# Patient Record
Sex: Female | Born: 1950 | Race: White | Hispanic: No | Marital: Married | Smoking: Never smoker
Health system: Southern US, Community
[De-identification: ages and names within clinical notes are randomized; demographics above are authoritative.]

## PROBLEM LIST (undated history)

## (undated) DIAGNOSIS — C801 Malignant (primary) neoplasm, unspecified: Secondary | ICD-10-CM

## (undated) DIAGNOSIS — E079 Disorder of thyroid, unspecified: Secondary | ICD-10-CM

## (undated) DIAGNOSIS — E7800 Pure hypercholesterolemia, unspecified: Secondary | ICD-10-CM

## (undated) DIAGNOSIS — I10 Essential (primary) hypertension: Secondary | ICD-10-CM

## (undated) DIAGNOSIS — E119 Type 2 diabetes mellitus without complications: Secondary | ICD-10-CM

## (undated) DIAGNOSIS — J45909 Unspecified asthma, uncomplicated: Secondary | ICD-10-CM

## (undated) HISTORY — PX: BREAST BIOPSY: SHX20

## (undated) HISTORY — PX: KNEE SURGERY: SHX244

---

## 2004-10-29 ENCOUNTER — Ambulatory Visit: Payer: Self-pay

## 2004-12-02 ENCOUNTER — Ambulatory Visit: Payer: Self-pay

## 2004-12-17 ENCOUNTER — Ambulatory Visit: Payer: Self-pay

## 2008-02-01 ENCOUNTER — Emergency Department: Payer: Self-pay | Admitting: Unknown Physician Specialty

## 2008-02-01 ENCOUNTER — Other Ambulatory Visit: Payer: Self-pay

## 2008-11-21 ENCOUNTER — Ambulatory Visit: Payer: Self-pay | Admitting: Internal Medicine

## 2010-07-03 ENCOUNTER — Ambulatory Visit: Payer: Self-pay | Admitting: Urology

## 2010-12-14 ENCOUNTER — Emergency Department: Payer: Self-pay | Admitting: Unknown Physician Specialty

## 2014-09-14 ENCOUNTER — Emergency Department: Payer: Self-pay | Admitting: Emergency Medicine

## 2015-09-07 IMAGING — US US EXTREM NON VASC*L* COMPLETE
1 series · 13 of 20 positions shown · non-contrast
Comparison: Left lower extremity venous Doppler ultrasound
performed 12/17/2004

CLINICAL DATA: Swelling at the medial and posterior aspect of the
left knee, of uncertain etiology. Initial encounter.

EXAM:
ULTRASOUND LEFT LOWER EXTREMITY LIMITED
TECHNIQUE: Ultrasound examination of the lower extremity soft tissues was
performed in the area of clinical concern.

[Series 1: us extrem non vasc*left* complete · 0.06mm/px · 20 acquisitions, 13 frames shown]
[im 1/20]
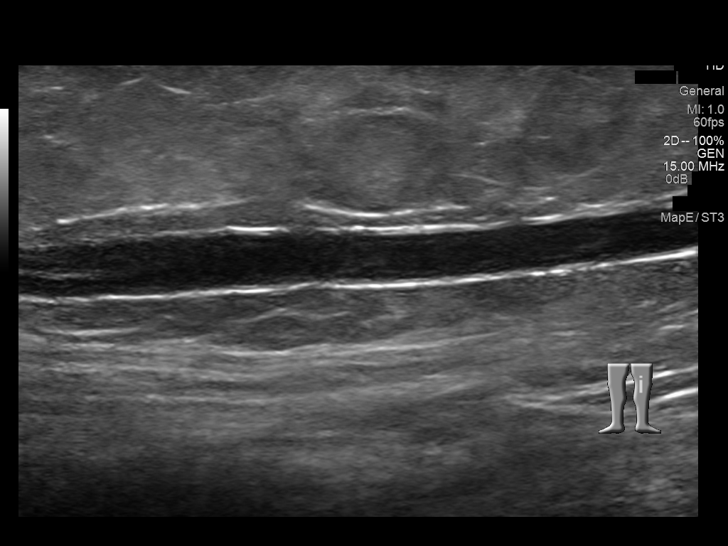
[im 3/20]
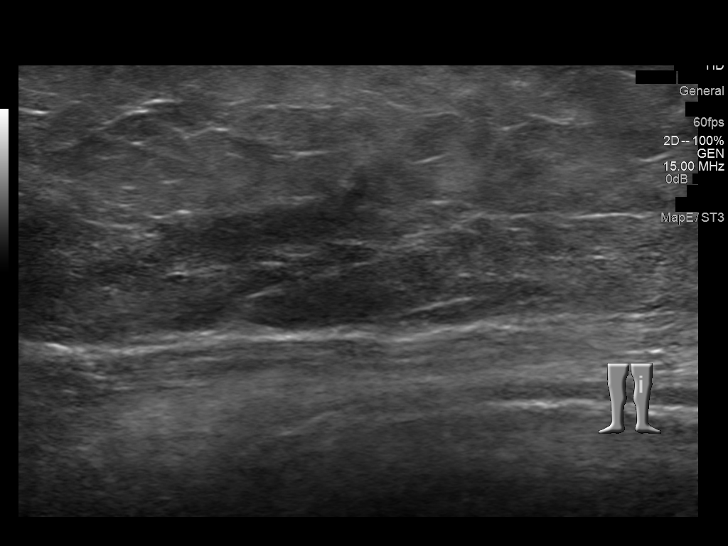
[im 4/20]
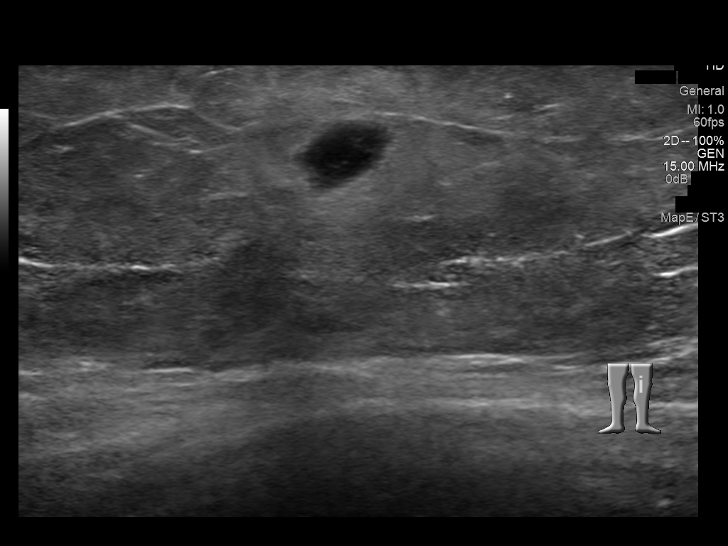
[im 6/20]
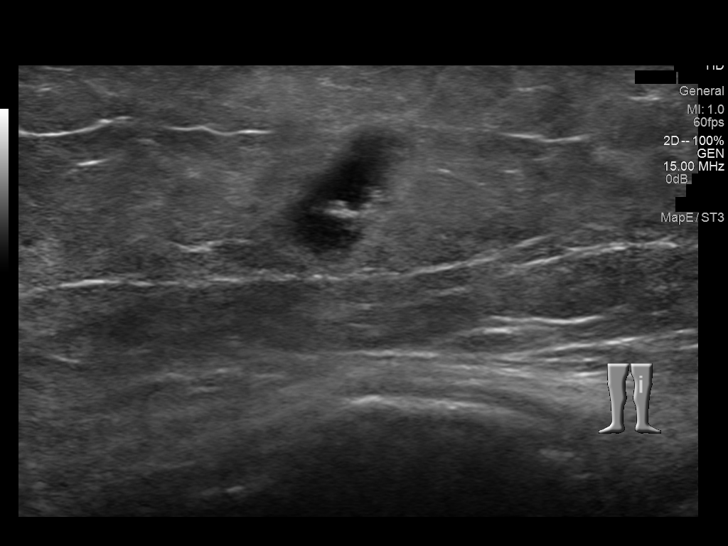
[im 7/20]
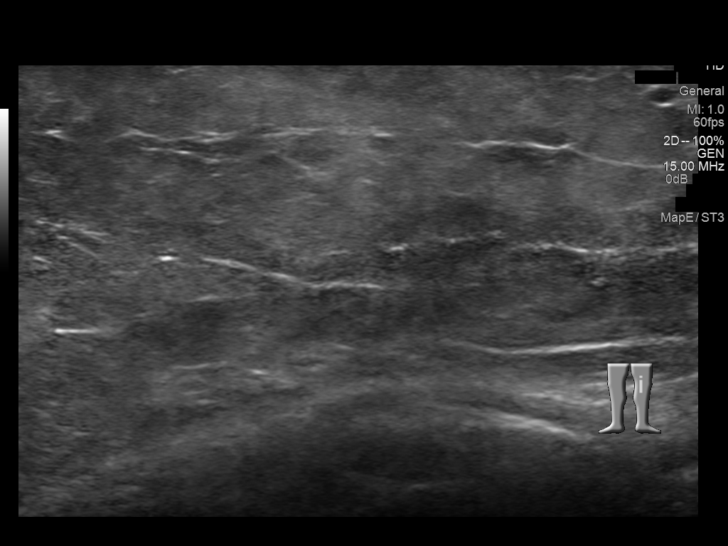
[im 9/20]
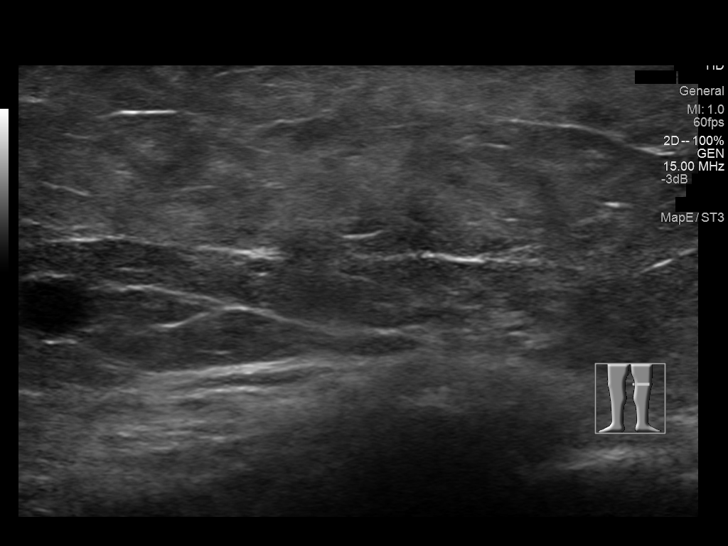
[im 11/20]
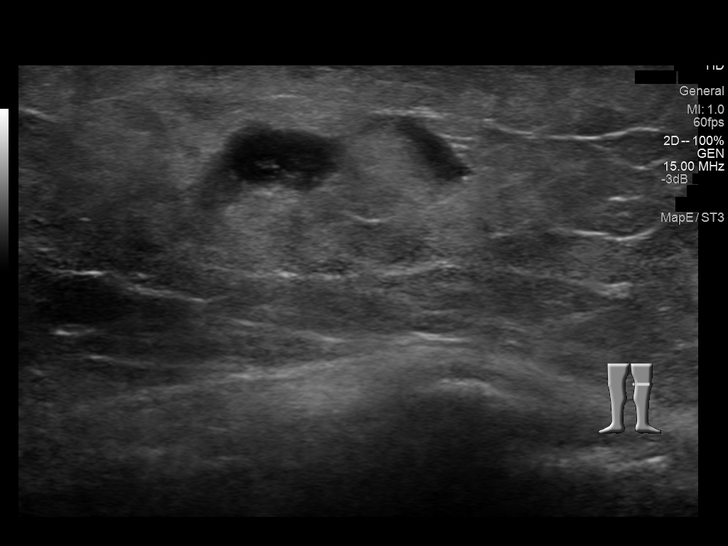
[im 12/20]
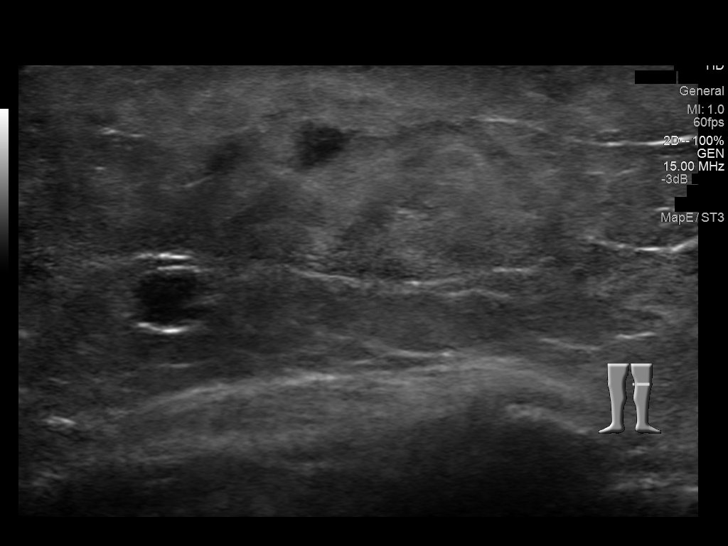
[im 14/20]
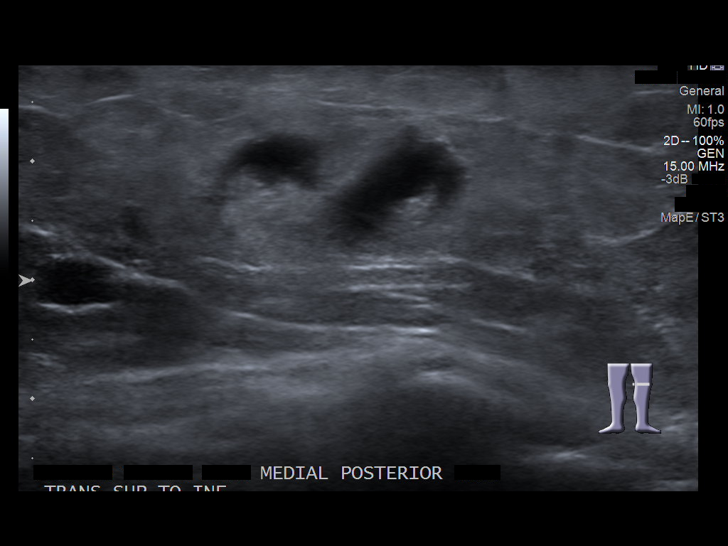
[im 15/20]
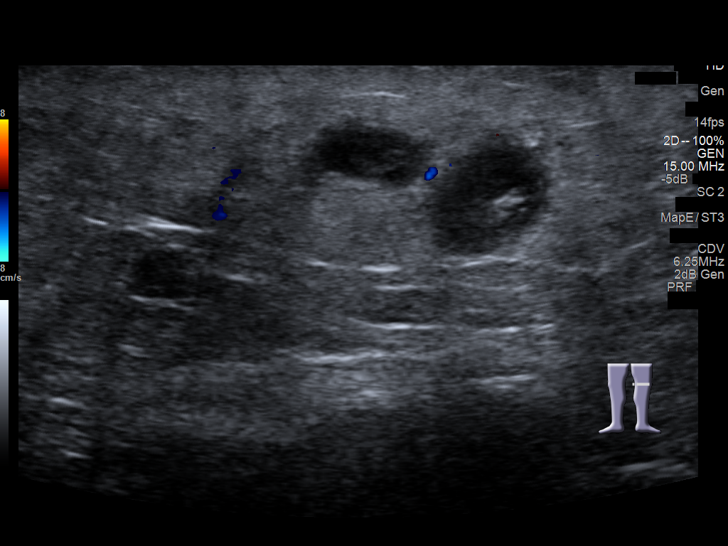
[im 17/20]
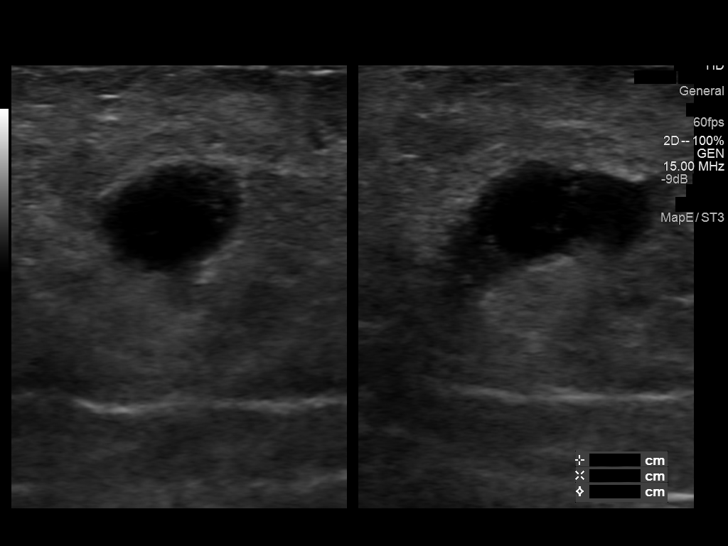
[im 18/20]
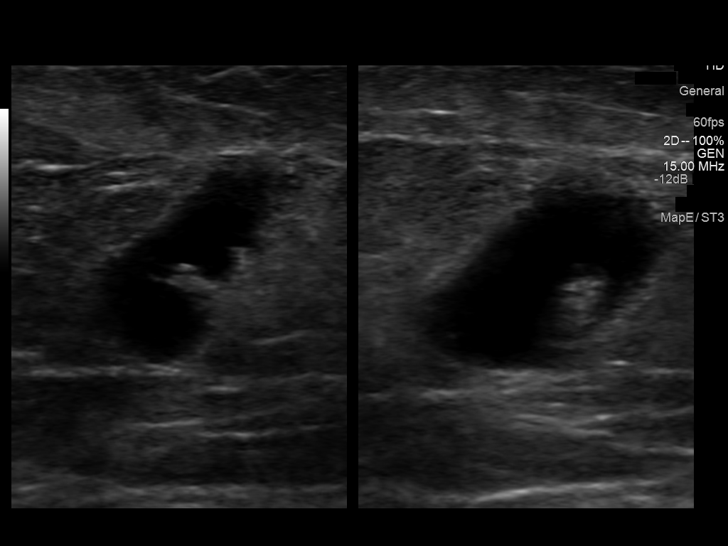
[im 20/20]
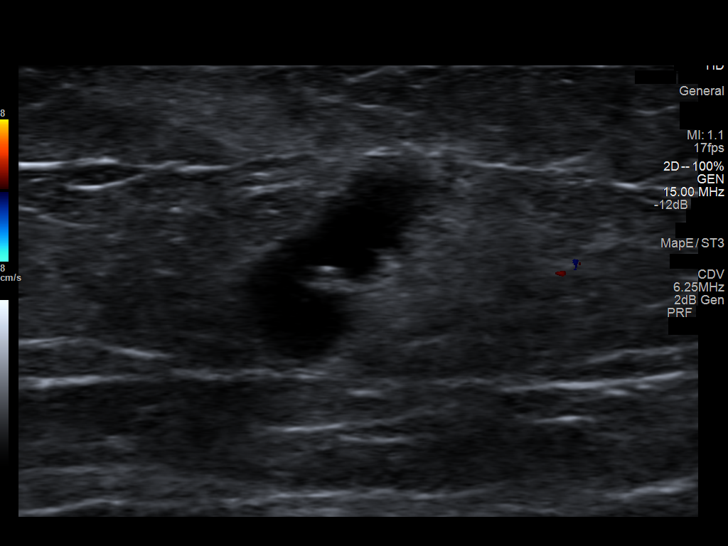

[13 of 20 positions shown; findings below may reference images not displayed]

FINDINGS: Two small collections of fluid are seen within the soft tissues at
the site of swelling and tenderness, medial and posterior to the
left knee. There is suggestion of a collection of fluid in this
region on the prior ultrasound. These measure 1.2 x 0.8 x 0.6 cm,
and 1.4 x 1.3 x 0.6 cm. On cine images, these may be contiguous, and
raise question for a Baker's cyst. No abnormal associated blood flow
is seen on limited color Doppler evaluation. The surrounding soft
tissues are grossly unremarkable in appearance.
IMPRESSION: Two small collections of fluid within the soft tissues, medial and
posterior to the left knee, at the site of swelling and tenderness.
These may be continuous and could reflect a Baker's cyst. No
abnormal associated blood flow seen. This may have been present in

## 2022-11-12 ENCOUNTER — Other Ambulatory Visit: Payer: Self-pay

## 2022-11-12 ENCOUNTER — Encounter: Payer: Self-pay | Admitting: Emergency Medicine

## 2022-11-12 ENCOUNTER — Emergency Department
Admission: EM | Admit: 2022-11-12 | Discharge: 2022-11-12 | Disposition: A | Discharge status: Home / Self Care | Payer: Medicare Other | Attending: Student in an Organized Health Care Education/Training Program | Admitting: Student in an Organized Health Care Education/Training Program

## 2022-11-12 DIAGNOSIS — C801 Malignant (primary) neoplasm, unspecified: Secondary | ICD-10-CM

## 2022-11-12 DIAGNOSIS — E11649 Type 2 diabetes mellitus with hypoglycemia without coma: Secondary | ICD-10-CM | POA: Insufficient documentation

## 2022-11-12 DIAGNOSIS — Z8616 Personal history of COVID-19: Secondary | ICD-10-CM | POA: Diagnosis not present

## 2022-11-12 DIAGNOSIS — E162 Hypoglycemia, unspecified: Secondary | ICD-10-CM

## 2022-11-12 HISTORY — DX: Pure hypercholesterolemia, unspecified: E78.00

## 2022-11-12 HISTORY — DX: Disorder of thyroid, unspecified: E07.9

## 2022-11-12 HISTORY — DX: Malignant (primary) neoplasm, unspecified: C80.1

## 2022-11-12 HISTORY — DX: Essential (primary) hypertension: I10

## 2022-11-12 HISTORY — DX: Unspecified asthma, uncomplicated: J45.909

## 2022-11-12 HISTORY — DX: Type 2 diabetes mellitus without complications: E11.9

## 2022-11-12 LAB — COMPREHENSIVE METABOLIC PANEL
ALT: 11 U/L (ref 0–44)
AST: 24 U/L (ref 15–41)
Albumin: 4.1 g/dL (ref 3.5–5.0)
Alkaline Phosphatase: 54 U/L (ref 38–126)
Anion gap: 11 (ref 5–15)
BUN: 17 mg/dL (ref 8–23)
CO2: 24 mmol/L (ref 22–32)
Calcium: 8.8 mg/dL — ABNORMAL LOW (ref 8.9–10.3)
Chloride: 103 mmol/L (ref 98–111)
Creatinine, Ser: 0.82 mg/dL (ref 0.44–1.00)
GFR, Estimated: >60 mL/min (ref >60)
Glucose, Bld: 126 mg/dL — ABNORMAL HIGH (ref 70–99)
Potassium: 3.3 mmol/L — ABNORMAL LOW (ref 3.5–5.1)
Sodium: 138 mmol/L (ref 135–145)
Total Bilirubin: 0.7 mg/dL (ref 0.3–1.2)
Total Protein: 7.3 g/dL (ref 6.5–8.1)

## 2022-11-12 LAB — CBC WITH DIFFERENTIAL/PLATELET
Abs Immature Granulocytes: 0.02 10*3/uL (ref 0.00–0.07)
Basophils Absolute: 0 10*3/uL (ref 0.0–0.1)
Basophils Relative: 1 %
Eosinophils Absolute: 0 10*3/uL (ref 0.0–0.5)
Eosinophils Relative: 0 %
HCT: 41.7 % (ref 36.0–46.0)
Hemoglobin: 13.2 g/dL (ref 12.0–15.0)
Immature Granulocytes: 0 %
Lymphocytes Relative: 19 %
Lymphs Abs: 1.6 10*3/uL (ref 0.7–4.0)
MCH: 25.5 pg — ABNORMAL LOW (ref 26.0–34.0)
MCHC: 31.7 g/dL (ref 30.0–36.0)
MCV: 80.7 fL (ref 80.0–100.0)
Monocytes Absolute: 0.5 10*3/uL (ref 0.1–1.0)
Monocytes Relative: 6 %
Neutro Abs: 6.2 10*3/uL (ref 1.7–7.7)
Neutrophils Relative %: 74 %
Platelets: 384 10*3/uL (ref 150–400)
RBC: 5.17 MIL/uL — ABNORMAL HIGH (ref 3.87–5.11)
RDW: 15.8 % — ABNORMAL HIGH (ref 11.5–15.5)
WBC: 8.4 10*3/uL (ref 4.0–10.5)
nRBC: 0 % (ref 0.0–0.2)

## 2022-11-12 LAB — CBG MONITORING, ED
Glucose-Capillary: 141 mg/dL — ABNORMAL HIGH (ref 70–99)
Glucose-Capillary: 115 mg/dL — ABNORMAL HIGH (ref 70–99)

## 2022-11-12 MED ORDER — SODIUM CHLORIDE 0.9 % IV BOLUS
1000.0000 mL | Freq: Once | INTRAVENOUS | Status: AC
  Administered 2022-11-12: 1000 mL via INTRAVENOUS

## 2022-11-12 NOTE — ED Triage Notes (Signed)
Pt states took her insulin as prescribed yesterday and today but does not check sugar at home. Pt feeling much better since getting glucose from EMS.   CBG 141

## 2022-11-12 NOTE — ED Triage Notes (Signed)
Patient arrived by EMS from home. Reports cramps all over. Initial CBG 49 with EMS. EMS administered oral glucose and got 69 CBG reading. Then gave 50D10 glucose and rose to 155CBG.  A&O x4 upon arrival   EMS vitals: 156/102b/p 118HR 97.6oral 99% RA

## 2022-11-12 NOTE — ED Provider Notes (Signed)
[facility] Provider Note    Event Date/Time   First MD Initiated Contact with Patient 11/12/22 1219     (approximate)   History   Hypoglycemia   HPI  Elizabeth Walls is a 72 y.o. female with a history of diabetes presents to the ER for evaluation of hypoglycemic episode that occurred this morning.  Not had anything to eat or drink this morning.  States that she has not felt well since July of last year when she got COVID again.  Frequently has poor p.o. intake and generalized malaise.  Denies any pain today.  Felt like her blood sugar was dropping went down to 49.  EMS was called she was given D50 with improvement.  She feels much better but admits that she has not had to pee this morning and does feel hungry.     Physical Exam   Triage Vital Signs: ED Triage Vitals  Enc Vitals Group     BP 11/12/22 1149 131/74     Pulse Rate 11/12/22 1149 (!) 118     Resp 11/12/22 1149 18     Temp 11/12/22 1155 98.2 F (36.8 C)     Temp Source 11/12/22 1155 Oral     SpO2 11/12/22 1149 100 %     Weight 11/12/22 1150 187 lb (84.8 kg)     Height 11/12/22 1150 5\' 4"  (1.626 m)     Head Circumference --      Peak Flow --      Pain Score 11/12/22 1150 2     Pain Loc --      Pain Edu? --      Excl. in GC? --     Most recent vital signs: Vitals:   11/12/22 1149 11/12/22 1155  BP: 131/74   Pulse: (!) 118   Resp: 18   Temp:  98.2 F (36.8 C)  SpO2: 100%      Constitutional: Alert  Eyes: Conjunctivae are normal.  Head: Atraumatic. Nose: No congestion/rhinnorhea. Mouth/Throat: Mucous membranes are moist.   Neck: Painless ROM.  Cardiovascular:   Good peripheral circulation. Respiratory: Normal respiratory effort.  No retractions.  Gastrointestinal: Soft and nontender.  Musculoskeletal:  no deformity Neurologic:  MAE spontaneously. No gross focal neurologic deficits are appreciated.  Skin:  Skin is warm, dry and intact. No rash noted. Psychiatric: Mood and  affect are normal. Speech and behavior are normal.    ED Results / Procedures / Treatments   Labs (all labs ordered are listed, but only abnormal results are displayed) Labs Reviewed  CBC WITH DIFFERENTIAL/PLATELET - Abnormal; Notable for the following components:      Result Value   RBC 5.17 (*)    MCH 25.5 (*)    RDW 15.8 (*)    All other components within normal limits  COMPREHENSIVE METABOLIC PANEL - Abnormal; Notable for the following components:   Potassium 3.3 (*)    Glucose, Bld 126 (*)    Calcium 8.8 (*)    All other components within normal limits  CBG MONITORING, ED - Abnormal; Notable for the following components:   Glucose-Capillary 141 (*)    All other components within normal limits  CBG MONITORING, ED - Abnormal; Notable for the following components:   Glucose-Capillary 115 (*)    All other components within normal limits  URINALYSIS, ROUTINE W REFLEX MICROSCOPIC     EKG     RADIOLOGY    PROCEDURES:  Critical Care performed: No  Procedures  MEDICATIONS ORDERED IN ED: Medications  sodium chloride 0.9 % bolus 1,000 mL (1,000 mLs Intravenous New Bag/Given 11/12/22 1317)     IMPRESSION / MDM / ASSESSMENT AND PLAN / ED COURSE  I reviewed the triage vital signs and the nursing notes.                              Differential diagnosis includes, but is not limited to, hyperglycemia, dehydration, electrolyte abnormality, medication effect, sepsis  Patient presenting to the ER for evaluation of symptoms as described above.  Based on symptoms, risk factors and considered above differential, this presenting complaint could reflect a potentially life-threatening illness therefore the patient will be placed on continuous pulse oximetry and telemetry for monitoring.  Laboratory evaluation will be sent to evaluate for the above complaints.      Clinical Course as of 11/12/22 1409  Thu Nov 12, 2022  1408 Patient reassessed.  She is tolerating p.o.   Blood work is reassuring.  Denies any symptoms of UTI dysuria.  Heart resolved after IV fluids.  Is not septic appearing is requesting discharge home does appear reasonable for outpatient follow-up. [PR]    Clinical Course User Index [PR] Willy Eddy, MD     FINAL CLINICAL IMPRESSION(S) / ED DIAGNOSES   Final diagnoses:  Hypoglycemia     Rx / DC Orders   ED Discharge Orders     None        Note:  This document was prepared using Dragon voice recognition software and may include unintentional dictation errors.    Willy Eddy, MD 11/12/22 1409

## 2022-11-14 ENCOUNTER — Emergency Department
Admission: EM | Admit: 2022-11-14 | Discharge: 2022-11-14 | Disposition: A | Discharge status: Home / Self Care | Payer: Medicare Other | Attending: Emergency Medicine | Admitting: Emergency Medicine

## 2022-11-14 ENCOUNTER — Emergency Department: Payer: Medicare Other

## 2022-11-14 ENCOUNTER — Other Ambulatory Visit: Payer: Self-pay

## 2022-11-14 DIAGNOSIS — E162 Hypoglycemia, unspecified: Secondary | ICD-10-CM | POA: Diagnosis present

## 2022-11-14 DIAGNOSIS — E11649 Type 2 diabetes mellitus with hypoglycemia without coma: Secondary | ICD-10-CM | POA: Diagnosis not present

## 2022-11-14 DIAGNOSIS — J45909 Unspecified asthma, uncomplicated: Secondary | ICD-10-CM | POA: Insufficient documentation

## 2022-11-14 DIAGNOSIS — E039 Hypothyroidism, unspecified: Secondary | ICD-10-CM | POA: Insufficient documentation

## 2022-11-14 DIAGNOSIS — I10 Essential (primary) hypertension: Secondary | ICD-10-CM | POA: Insufficient documentation

## 2022-11-14 DIAGNOSIS — Z8543 Personal history of malignant neoplasm of ovary: Secondary | ICD-10-CM | POA: Insufficient documentation

## 2022-11-14 LAB — COMPREHENSIVE METABOLIC PANEL
ALT: 9 U/L (ref 0–44)
AST: 26 U/L (ref 15–41)
Albumin: 3.8 g/dL (ref 3.5–5.0)
Alkaline Phosphatase: 59 U/L (ref 38–126)
Anion gap: 12 (ref 5–15)
BUN: 17 mg/dL (ref 8–23)
CO2: 20 mmol/L — ABNORMAL LOW (ref 22–32)
Calcium: 8.6 mg/dL — ABNORMAL LOW (ref 8.9–10.3)
Chloride: 104 mmol/L (ref 98–111)
Creatinine, Ser: 0.83 mg/dL (ref 0.44–1.00)
GFR, Estimated: >60 mL/min (ref >60)
Glucose, Bld: 125 mg/dL — ABNORMAL HIGH (ref 70–99)
Potassium: 3.3 mmol/L — ABNORMAL LOW (ref 3.5–5.1)
Sodium: 136 mmol/L (ref 135–145)
Total Bilirubin: 0.8 mg/dL (ref 0.3–1.2)
Total Protein: 6.9 g/dL (ref 6.5–8.1)

## 2022-11-14 LAB — CBC WITH DIFFERENTIAL/PLATELET
Abs Immature Granulocytes: 0.04 10*3/uL (ref 0.00–0.07)
Basophils Absolute: 0 10*3/uL (ref 0.0–0.1)
Basophils Relative: 0 %
Eosinophils Absolute: 0.1 10*3/uL (ref 0.0–0.5)
Eosinophils Relative: 1 %
HCT: 41.3 % (ref 36.0–46.0)
Hemoglobin: 13.3 g/dL (ref 12.0–15.0)
Immature Granulocytes: 0 %
Lymphocytes Relative: 21 %
Lymphs Abs: 2.3 10*3/uL (ref 0.7–4.0)
MCH: 26.3 pg (ref 26.0–34.0)
MCHC: 32.2 g/dL (ref 30.0–36.0)
MCV: 81.8 fL (ref 80.0–100.0)
Monocytes Absolute: 0.6 10*3/uL (ref 0.1–1.0)
Monocytes Relative: 6 %
Neutro Abs: 7.9 10*3/uL — ABNORMAL HIGH (ref 1.7–7.7)
Neutrophils Relative %: 72 %
Platelets: 292 10*3/uL (ref 150–400)
RBC: 5.05 MIL/uL (ref 3.87–5.11)
RDW: 15.9 % — ABNORMAL HIGH (ref 11.5–15.5)
WBC: 11 10*3/uL — ABNORMAL HIGH (ref 4.0–10.5)
nRBC: 0 % (ref 0.0–0.2)

## 2022-11-14 LAB — CBG MONITORING, ED
Glucose-Capillary: 142 mg/dL — ABNORMAL HIGH (ref 70–99)
Glucose-Capillary: 151 mg/dL — ABNORMAL HIGH (ref 70–99)
Glucose-Capillary: 100 mg/dL — ABNORMAL HIGH (ref 70–99)

## 2022-11-14 MED ORDER — POTASSIUM CHLORIDE CRYS ER 20 MEQ PO TBCR
20.0000 meq | EXTENDED_RELEASE_TABLET | Freq: Once | ORAL | Status: AC
  Administered 2022-11-14: 20 meq via ORAL
  Filled 2022-11-14: qty 1

## 2022-11-14 NOTE — Discharge Instructions (Addendum)
Your blood sugar was very low this morning and you should decrease your insulin dose to 50 units nightly in order to decrease the chances of this happening in the future.  You should continue to check your blood sugar regularly and schedule a follow-up appointment with your primary care doctor in about 1 week.  You should return to the ER for any new or worsening symptoms.

## 2022-11-14 NOTE — ED Triage Notes (Signed)
Pt reports her blood sugar dropped again this am. Pt reports hx fo the same a few times with last episode being Thursday. Pt reports has not told her MD about these episodes.

## 2022-11-14 NOTE — ED Provider Notes (Signed)
[facility] Provider Note    Event Date/Time   First MD Initiated Contact with Patient 11/14/22 [phone]     (approximate)   History   Chief Complaint Hypoglycemia   HPI  Elizabeth Walls is a 72 y.o. female with past medical history of hypertension, diabetes, asthma, hypothyroidism, and ovarian cancer who presents to the ED complaining of hypoglycemia.  Per spouse, patient was found to be very disoriented when he woke up this morning, when he became concerned that her blood sugar was low.  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  CBG improved to 59 with EMS initially, then was 106 just prior to arrival.  Patient is now awake and alert, states she does not remember what happened.  She states that she gave herself her usual 60 units of Basaglar last night, but reports this is the second time she has become hypoglycemic in the past 2 days.  She denies any fevers, nausea, vomiting, diarrhea, or dysuria but does endorse a cough.  She states she has had poor appetite chronically and has been losing weight recently.     Physical Exam   Triage Vital Signs: ED Triage Vitals  Enc Vitals Group     BP 11/14/22 0942 (!) 150/66     Pulse Rate 11/14/22 0942 (!) 103     Resp 11/14/22 0942 20     Temp 11/14/22 0942 (!) 97 F (36.1 C)     Temp Source 11/14/22 0942 Oral     SpO2 11/14/22 0942 98 %     Weight 11/14/22 0941 187 lb 6.3 oz (85 kg)     Height 11/14/22 0941 5\' 4"  (1.626 m)     Head Circumference --      Peak Flow --      Pain Score 11/14/22 0940 0     Pain Loc --      Pain Edu? --      Excl. in GC? --     Most recent vital signs: Vitals:   11/14/22 1000 11/14/22 1100  BP: (!) 121/59   Pulse: 97 99  Resp:    Temp:    SpO2: 94% 97%    Constitutional: Alert and oriented. Eyes: Conjunctivae are normal. Head: Atraumatic. Nose: No congestion/rhinnorhea. Mouth/Throat: Mucous membranes are moist.   Cardiovascular: Normal rate, regular rhythm. Grossly normal heart sounds.  2+ radial pulses bilaterally. Respiratory: Normal respiratory effort.  No retractions. Lungs CTAB. Gastrointestinal: Soft and nontender. No distention. Musculoskeletal: No lower extremity tenderness nor edema.  Neurologic:  Normal speech and language. No gross focal neurologic deficits are appreciated.    ED Results / Procedures / Treatments   Labs (all labs ordered are listed, but only abnormal results are displayed) Labs Reviewed  CBC WITH DIFFERENTIAL/PLATELET - Abnormal; Notable for the following components:      Result Value   WBC 11.0 (*)    RDW 15.9 (*)    Neutro Abs 7.9 (*)    All other components within normal limits  COMPREHENSIVE METABOLIC PANEL - Abnormal; Notable for the following components:   Potassium 3.3 (*)    CO2 20 (*)    Glucose, Bld 125 (*)    Calcium 8.6 (*)    All other components within normal limits  CBG MONITORING, ED - Abnormal; Notable for the following components:   Glucose-Capillary 142 (*)    All other components within normal limits  CBG  MONITORING, ED - Abnormal; Notable for the following components:   Glucose-Capillary 151 (*)    All other components within normal limits  URINALYSIS, ROUTINE W REFLEX MICROSCOPIC     EKG  ED ECG REPORT I, Chesley Noon, the attending physician, personally viewed and interpreted this ECG.   Date: 11/14/2022  EKG Time: 9:43  Rate: 105  Rhythm: sinus tachycardia  Axis: Normal  Intervals:none  ST&T Change: None  RADIOLOGY Chest x-ray reviewed and interpreted by me with no infiltrate, edema, or effusion.  PROCEDURES:  Critical Care performed: No  Procedures   MEDICATIONS ORDERED IN ED: Medications  potassium chloride SA (KLOR-CON M) CR tablet 20 mEq (20 mEq Oral Given 11/14/22 1044)     IMPRESSION / MDM / ASSESSMENT AND PLAN / ED COURSE  I reviewed the triage vital signs and the nursing notes.                               72 y.o. female with past medical history of hypertension, diabetes, asthma, hypothyroidism, and ovarian cancer who presents to the ED complaining of episode of hypoglycemia earlier this morning when husband found her disoriented and she had initial CBG of 41.  Patient's presentation is most consistent with acute presentation with potential threat to life or bodily function.  Differential diagnosis includes, but is not limited to, sepsis, UTI, pneumonia, dehydration, electrolyte abnormality, AKI, hypoglycemia.  Patient nontoxic-appearing and in no acute distress, vital signs are unremarkable.  She is awake and alert with no focal neurologic deficits on exam, will recheck blood glucose.  Will screen for infectious process with chest x-ray and urinalysis, but no findings concerning for sepsis.  With her decreased oral intake and weight loss, suspect she will need decreased dose of insulin, especially given this is her second episode of hypoglycemia in the past 2 days.  We will observe here in the ED to ensure no recurrent hypoglycemia, will give patient something to eat.  CBG remained stable on multiple rechecks, no evidence of infectious process or sepsis at this time.  Chest x-ray is unremarkable, patient has been unable to provide urine sample but denies any urinary symptoms.  She was counseled to decrease her insulin to 50 units nightly and to schedule close follow-up with her PCP.  She was counseled to return to the ED for new or worsening symptoms, patient agrees with plan.      FINAL CLINICAL IMPRESSION(S) / ED DIAGNOSES   Final diagnoses:  Hypoglycemia     Rx / DC Orders   ED Discharge Orders     None        Note:  This document was prepared using Dragon voice recognition software and may include unintentional dictation errors.   Chesley Noon, MD 11/14/22 [phone]

## 2022-11-14 NOTE — ED Triage Notes (Signed)
Pt in via EMS from home with c/o low blood sugar. EMS reports fire was giving her oral glucose upon their arrival. CBG by frire was 41, EMS reports 59 CBG after oral glucose. Prior to entering the ED it was 106. 153/81, 107HR, 98% RA, alert and oriented.
